# Patient Record
Sex: Female | Born: 1945 | Race: Black or African American | Hispanic: No | Marital: Married | Smoking: Never smoker
Health system: Southern US, Community
[De-identification: ages and names within clinical notes are randomized; demographics above are authoritative.]

## PROBLEM LIST (undated history)

## (undated) DIAGNOSIS — E039 Hypothyroidism, unspecified: Secondary | ICD-10-CM

## (undated) DIAGNOSIS — IMO0002 Reserved for concepts with insufficient information to code with codable children: Secondary | ICD-10-CM

## (undated) DIAGNOSIS — E785 Hyperlipidemia, unspecified: Secondary | ICD-10-CM

## (undated) DIAGNOSIS — E1065 Type 1 diabetes mellitus with hyperglycemia: Secondary | ICD-10-CM

## (undated) DIAGNOSIS — I10 Essential (primary) hypertension: Secondary | ICD-10-CM

## (undated) HISTORY — PX: ABDOMINAL HYSTERECTOMY: SHX81

---

## 2013-11-05 ENCOUNTER — Encounter (HOSPITAL_COMMUNITY): Payer: Self-pay | Admitting: Cardiology

## 2013-11-05 ENCOUNTER — Inpatient Hospital Stay (HOSPITAL_COMMUNITY): Payer: Medicare (Managed Care)

## 2013-11-05 ENCOUNTER — Inpatient Hospital Stay (HOSPITAL_COMMUNITY)
Admission: EM | Admit: 2013-11-05 | Discharge: 2013-11-08 | DRG: 919 | Disposition: A | Discharge status: Home / Self Care | Payer: Medicare (Managed Care) | Attending: Pulmonary Disease | Admitting: Pulmonary Disease

## 2013-11-05 ENCOUNTER — Emergency Department (HOSPITAL_COMMUNITY): Payer: Medicare (Managed Care)

## 2013-11-05 DIAGNOSIS — D638 Anemia in other chronic diseases classified elsewhere: Secondary | ICD-10-CM | POA: Diagnosis present

## 2013-11-05 DIAGNOSIS — E875 Hyperkalemia: Secondary | ICD-10-CM | POA: Diagnosis present

## 2013-11-05 DIAGNOSIS — E1110 Type 2 diabetes mellitus with ketoacidosis without coma: Secondary | ICD-10-CM

## 2013-11-05 DIAGNOSIS — T85694A Other mechanical complication of insulin pump, initial encounter: Principal | ICD-10-CM | POA: Diagnosis present

## 2013-11-05 DIAGNOSIS — Z794 Long term (current) use of insulin: Secondary | ICD-10-CM

## 2013-11-05 DIAGNOSIS — I10 Essential (primary) hypertension: Secondary | ICD-10-CM | POA: Diagnosis present

## 2013-11-05 DIAGNOSIS — N179 Acute kidney failure, unspecified: Secondary | ICD-10-CM | POA: Diagnosis present

## 2013-11-05 DIAGNOSIS — D72829 Elevated white blood cell count, unspecified: Secondary | ICD-10-CM

## 2013-11-05 DIAGNOSIS — IMO0002 Reserved for concepts with insufficient information to code with codable children: Secondary | ICD-10-CM | POA: Diagnosis present

## 2013-11-05 DIAGNOSIS — E1010 Type 1 diabetes mellitus with ketoacidosis without coma: Secondary | ICD-10-CM | POA: Diagnosis present

## 2013-11-05 DIAGNOSIS — Y838 Other surgical procedures as the cause of abnormal reaction of the patient, or of later complication, without mention of misadventure at the time of the procedure: Secondary | ICD-10-CM | POA: Diagnosis present

## 2013-11-05 DIAGNOSIS — E876 Hypokalemia: Secondary | ICD-10-CM | POA: Diagnosis present

## 2013-11-05 DIAGNOSIS — Z9641 Presence of insulin pump (external) (internal): Secondary | ICD-10-CM

## 2013-11-05 DIAGNOSIS — E039 Hypothyroidism, unspecified: Secondary | ICD-10-CM | POA: Diagnosis present

## 2013-11-05 DIAGNOSIS — G9340 Encephalopathy, unspecified: Secondary | ICD-10-CM | POA: Diagnosis not present

## 2013-11-05 DIAGNOSIS — Z79899 Other long term (current) drug therapy: Secondary | ICD-10-CM

## 2013-11-05 DIAGNOSIS — R739 Hyperglycemia, unspecified: Secondary | ICD-10-CM | POA: Diagnosis present

## 2013-11-05 DIAGNOSIS — R4182 Altered mental status, unspecified: Secondary | ICD-10-CM | POA: Diagnosis present

## 2013-11-05 DIAGNOSIS — N3289 Other specified disorders of bladder: Secondary | ICD-10-CM | POA: Diagnosis present

## 2013-11-05 DIAGNOSIS — E1065 Type 1 diabetes mellitus with hyperglycemia: Secondary | ICD-10-CM | POA: Diagnosis present

## 2013-11-05 DIAGNOSIS — E861 Hypovolemia: Secondary | ICD-10-CM | POA: Diagnosis present

## 2013-11-05 DIAGNOSIS — I959 Hypotension, unspecified: Secondary | ICD-10-CM | POA: Diagnosis present

## 2013-11-05 DIAGNOSIS — E785 Hyperlipidemia, unspecified: Secondary | ICD-10-CM | POA: Diagnosis present

## 2013-11-05 DIAGNOSIS — R9431 Abnormal electrocardiogram [ECG] [EKG]: Secondary | ICD-10-CM | POA: Diagnosis present

## 2013-11-05 HISTORY — DX: Hypothyroidism, unspecified: E03.9

## 2013-11-05 HISTORY — DX: Reserved for concepts with insufficient information to code with codable children: IMO0002

## 2013-11-05 HISTORY — DX: Hyperlipidemia, unspecified: E78.5

## 2013-11-05 HISTORY — DX: Type 1 diabetes mellitus with hyperglycemia: E10.65

## 2013-11-05 HISTORY — DX: Essential (primary) hypertension: I10

## 2013-11-05 LAB — URINALYSIS, ROUTINE W REFLEX MICROSCOPIC
Glucose, UA: >1000 mg/dL — AB
Leukocytes, UA: NEGATIVE
Nitrite: NEGATIVE
Specific Gravity, Urine: 1.025 (ref 1.005–1.030)
pH: 5 (ref 5.0–8.0)

## 2013-11-05 LAB — CBC
HCT: 37.9 % (ref 36.0–46.0)
Hemoglobin: 11.5 g/dL — ABNORMAL LOW (ref 12.0–15.0)
MCHC: 30.3 g/dL (ref 30.0–36.0)
MCV: 96.7 fL (ref 78.0–100.0)
WBC: 14.4 10*3/uL — ABNORMAL HIGH (ref 4.0–10.5)

## 2013-11-05 LAB — POCT I-STAT 3, ART BLOOD GAS (G3+)
O2 Saturation: 100 %
TCO2: 9 mmol/L (ref 0–100)
pCO2 arterial: 21.1 mmHg — ABNORMAL LOW (ref 35.0–45.0)
pO2, Arterial: 204 mmHg — ABNORMAL HIGH (ref 80.0–100.0)

## 2013-11-05 LAB — COMPREHENSIVE METABOLIC PANEL
ALT: 31 U/L (ref 0–35)
AST: 26 U/L (ref 0–37)
Albumin: 3.1 g/dL — ABNORMAL LOW (ref 3.5–5.2)
CO2: 9 mEq/L — CL (ref 19–32)
Chloride: 86 mEq/L — ABNORMAL LOW (ref 96–112)
GFR calc Af Amer: 17 mL/min — ABNORMAL LOW (ref >90)
GFR calc non Af Amer: 15 mL/min — ABNORMAL LOW (ref >90)
Glucose, Bld: 1164 mg/dL (ref 70–99)
Sodium: 128 mEq/L — ABNORMAL LOW (ref 135–145)
Total Bilirubin: 0.6 mg/dL (ref 0.3–1.2)

## 2013-11-05 LAB — URINE MICROSCOPIC-ADD ON

## 2013-11-05 LAB — POCT I-STAT, CHEM 8
Calcium, Ion: 1 mmol/L — ABNORMAL LOW (ref 1.13–1.30)
Glucose, Bld: >700 mg/dL (ref 70–99)
HCT: 38 % (ref 36.0–46.0)
Hemoglobin: 12.9 g/dL (ref 12.0–15.0)
TCO2: 11 mmol/L (ref 0–100)

## 2013-11-05 LAB — POCT I-STAT TROPONIN I: Troponin i, poc: 0.02 ng/mL (ref 0.00–0.08)

## 2013-11-05 LAB — PROTIME-INR: INR: 1.58 — ABNORMAL HIGH (ref 0.00–1.49)

## 2013-11-05 LAB — GLUCOSE, CAPILLARY: Glucose-Capillary: >600 mg/dL (ref 70–99)

## 2013-11-05 LAB — APTT: aPTT: 28 seconds (ref 24–37)

## 2013-11-05 MED ORDER — SODIUM CHLORIDE 0.9 % IV BOLUS (SEPSIS)
1000.0000 mL | Freq: Once | INTRAVENOUS | Status: AC
  Administered 2013-11-05: 1000 mL via INTRAVENOUS

## 2013-11-05 MED ORDER — SODIUM CHLORIDE 0.9 % IV SOLN
INTRAVENOUS | Status: DC
  Administered 2013-11-05 – 2013-11-06 (×2): via INTRAVENOUS

## 2013-11-05 MED ORDER — DEXTROSE-NACL 5-0.45 % IV SOLN
INTRAVENOUS | Status: DC
  Administered 2013-11-06: 08:00:00 via INTRAVENOUS

## 2013-11-05 MED ORDER — SODIUM BICARBONATE 8.4 % IV SOLN
50.0000 meq | Freq: Once | INTRAVENOUS | Status: AC
  Administered 2013-11-05: 50 meq via INTRAVENOUS

## 2013-11-05 MED ORDER — CALCIUM CHLORIDE 10 % IV SOLN
INTRAVENOUS | Status: DC | PRN
  Administered 2013-11-05: 1 g via INTRAVENOUS

## 2013-11-05 MED ORDER — PANTOPRAZOLE SODIUM 40 MG IV SOLR
40.0000 mg | INTRAVENOUS | Status: DC
  Administered 2013-11-05: 40 mg via INTRAVENOUS
  Filled 2013-11-05 (×2): qty 40

## 2013-11-05 MED ORDER — HEPARIN SODIUM (PORCINE) 5000 UNIT/ML IJ SOLN
5000.0000 [IU] | Freq: Three times a day (TID) | INTRAMUSCULAR | Status: DC
  Administered 2013-11-05 – 2013-11-06 (×2): 5000 [IU] via SUBCUTANEOUS
  Filled 2013-11-05 (×5): qty 1

## 2013-11-05 MED ORDER — ASPIRIN 300 MG RE SUPP
300.0000 mg | Freq: Once | RECTAL | Status: AC
  Administered 2013-11-05: 300 mg via RECTAL
  Filled 2013-11-05: qty 1

## 2013-11-05 MED ORDER — ASPIRIN 81 MG PO CHEW: 324.0000 mg | CHEWABLE_TABLET | Freq: Once | ORAL | Status: DC

## 2013-11-05 MED ORDER — ONDANSETRON HCL 4 MG/2ML IJ SOLN: 4.0000 mg | Freq: Four times a day (QID) | INTRAMUSCULAR | Status: DC | PRN

## 2013-11-05 MED ORDER — DEXTROSE 50 % IV SOLN
25.0000 mL | INTRAVENOUS | Status: DC | PRN
  Administered 2013-11-06: 25 mL via INTRAVENOUS
  Filled 2013-11-05: qty 50

## 2013-11-05 MED ORDER — SODIUM CHLORIDE 0.9 % IV SOLN: INTRAVENOUS | Status: DC

## 2013-11-05 MED ORDER — SODIUM CHLORIDE 0.9 % IV SOLN
INTRAVENOUS | Status: DC
  Administered 2013-11-05: 5.4 [IU]/h via INTRAVENOUS
  Administered 2013-11-06: 21.2 [IU]/h via INTRAVENOUS
  Administered 2013-11-06: 24.1 [IU]/h via INTRAVENOUS
  Filled 2013-11-05 (×4): qty 1

## 2013-11-05 MED ORDER — SODIUM CHLORIDE 0.9 % IV SOLN
INTRAVENOUS | Status: DC
  Administered 2013-11-05: 20 mL/h via INTRAVENOUS

## 2013-11-05 MED ORDER — SODIUM CHLORIDE 0.9 % IV SOLN: 1.0000 g | Freq: Once | INTRAVENOUS | Status: DC

## 2013-11-05 MED ORDER — SODIUM BICARBONATE 8.4 % IV SOLN: 50.0000 meq | Freq: Once | INTRAVENOUS | Status: DC

## 2013-11-05 MED ORDER — INSULIN ASPART 100 UNIT/ML ~~LOC~~ SOLN
10.0000 [IU] | Freq: Once | SUBCUTANEOUS | Status: AC
  Administered 2013-11-05: 10 [IU] via INTRAVENOUS
  Filled 2013-11-05: qty 1

## 2013-11-05 MED ORDER — CALCIUM CHLORIDE 10 % IV SOLN
INTRAVENOUS | Status: AC | PRN
  Administered 2013-11-05: 1 g via INTRAVENOUS

## 2013-11-05 MED ORDER — HEPARIN SODIUM (PORCINE) 5000 UNIT/ML IJ SOLN: 60.0000 [IU]/kg | INTRAMUSCULAR | Status: DC

## 2013-11-05 NOTE — Consult Note (Signed)
Reason for Consult:Acute ST elevation, RBBB, LAFB, brady cardia with need for transth. pacing   Referring Physician: ER MD  No primary provider on file.  In [facility] Primary Cardiologist:New Dr. Royann Shivers  Latoya Young is an 67 y.o. female.    Chief Complaint: altered mental status, elevated glucose, chest pain, but she fell this AM also   HPI: 67 year old AAF presents by EMS with AMS, glucose > 500, acute ST elevation.  She and her husband are visiting from outside [place], [place] and they do not believe her insulin pump is working well.  Her glucose became elevated last pm and continued to be elevated this AM.  She was stumbling around the motel room, actually fell once.  By this PM she was not responding well and husband called EMS.  EKG with RBBB and ST elevation.  But peaked T waves also, en route she also had significant bradycardia requiring  external pacing.  STEMI was called and we came to eval, though EKG seemed to reflect hyperkalemia.  Pt was responding, does have epigastric pain.   K+ found to be 7.7.  Treatment initiated.  EKG improved with resolution of ST elevation, though RBBB continues and LAFB.  HR improved as well.  BP low, pt appears dry.  STEMI was cancelled.  CCM has been asked to admit.  We will continue to follow.  External pacer turned off as pt has SR now.  Hx of diabetes since age 68, now on insulin pump.  She has hx of hypothyroid, Rx'd and poss. HTN, she is on low dose lopressor and zocor.  Recent UTI that was treated.       Past Medical History  Diagnosis Date  . Diabetes type 1, uncontrolled     uses insulin pump  . HTN (hypertension)   . Hyperlipidemia   . Hypothyroid     History reviewed. No pertinent past surgical history.  History reviewed. No pertinent family history. Parents without CAD, no family hx CAD.  Social History:  reports that she has never smoked. She has never used smokeless tobacco. She reports that she does not drink alcohol or use  illicit drugs. Married and 3 children.  Allergies: No Known Allergies  OUTPATIENT Meds: From [facility] # [phone] Lopressor 25 mg half tab BID Synthroid 50 mcg daily zocor 10 mg daily Diazepam    Results for orders placed during the hospital encounter of 11/05/13 (from the past 48 hour(s))  POCT I-STAT, CHEM 8     Status: Abnormal   Collection Time    11/05/13  4:51 PM      Result Value Range   Sodium 129 (*) 135 - 145 mEq/L   Potassium 7.7 (*) 3.5 - 5.1 mEq/L   Chloride 100  96 - 112 mEq/L   BUN 52 (*) 6 - 23 mg/dL   Creatinine, Ser 9.56 (*) 0.50 - 1.10 mg/dL   Glucose, Bld >213 (*) 70 - 99 mg/dL   Calcium, Ion 0.86 (*) 1.13 - 1.30 mmol/L   TCO2 11  0 - 100 mmol/L   Hemoglobin 12.9  12.0 - 15.0 g/dL   HCT 57.8  46.9 - 62.9 %   Comment NOTIFIED PHYSICIAN    POCT I-STAT 3, BLOOD GAS (G3+)     Status: Abnormal   Collection Time    11/05/13  4:56 PM      Result Value Range   pH, Arterial 7.206 (*) 7.350 - 7.450  pCO2 arterial 21.1 (*) 35.0 - 45.0 mmHg   pO2, Arterial 204.0 (*) 80.0 - 100.0 mmHg   Bicarbonate 8.4 (*) 20.0 - 24.0 mEq/L   TCO2 9  0 - 100 mmol/L   O2 Saturation 100.0     Acid-base deficit 18.0 (*) 0.0 - 2.0 mmol/L   Collection site RADIAL, ALLEN'S TEST ACCEPTABLE     Drawn by Operator     Sample type ARTERIAL     Comment NOTIFIED PHYSICIAN     No results found.  ROS: General:no colds or fevers, no weight changes, was in her usual state of health until last pm Skin:no rashes or ulcers HEENT:no blurred vision, no congestion CV:see HPI PUL:see HPI GI:no diarrhea constipation or melena, no indigestion GU:no hematuria, no dysuria MS:no joint pain, no claudication Neuro:no syncope, + lightheadedness, off balance today fell today Endo:+ diabetes, + thyroid disease   Blood pressure 95/45, pulse 78, temperature 97.6 F (36.4 C), temperature source Oral, resp. rate 21, SpO2 98.00%. PE: General: answers questions, but lethargic, ill  appearing Skin:Warm to cool and dry, brisk capillary refill HEENT:normocephalic, sclera clear, mucus membranes dry Neck:supple, no JVD, no bruits  Heart:S1S2 RRR without murmur, gallup, rub or click Lungs:clear, ant. without rales, rhonchi, or wheezes ZOX:WRUE, non tender, + BS, do not palpate liver spleen or masses, insulin pump in place Ext:no lower ext edema, ? pedal pulses with hypotension, 2+ radial pulses Neuro:alert and oriented, MAE, follows commands,     Assessment/Plan Principal Problem:   Hyperglycemia Active Problems:   Altered mental status   ST elevation, secondary to hyperkalemia   Hyperkalemia   Diabetes type 1, uncontrolled, new uncontrolled possibly due to pump malfunction  Plan:  No cardiac cath, believe EKG changes due to hyperglycemia. External pacer turned off now with SR. See Dr. Erin Hearing note.       [facility] R  Nurse Practitioner Certified [facility] Pager [phone] or after 5pm or weekends call [phone] 11/05/2013, 5:07 PM   Please also see my note. Agree with findings above. Thurmon Fair, MD, [facility] [phone] office [phone] pager

## 2013-11-05 NOTE — ED Provider Notes (Signed)
CSN: [id number]     Arrival date & time 11/05/13  1632 History   First MD Initiated Contact with Patient 11/05/13 1642     Chief Complaint  Patient presents with  . Altered Mental Status   (Consider location/radiation/quality/duration/timing/severity/associated sxs/prior Treatment) HPI Latoya Young is a 67 y.o. female who presents to the emergency department for concern of AMS, hyperglycemia, shock, and possible STEMI.  Per family, patient has history of IDDM and is on insulin pump.  She has been having problems with her blood glucose since last night and it has been slowly getting higher and higher.  She reported yesterday evening that she felt the insulin was not working.  Then today she started "going in and out of it."  Husband checked blood glucose and found that it was >600 and called EMS.  When EMS arrived, patient severely altered with BP of 70 systolic.  EKG done and showing RBBB with ST elevation.  Concern for possible STEMI.  Glucose >700.  NS bolus initiated.  Then patient had a pause lasting several seconds enroute.  TC pacing initiated. Patient brought in for further evaluation.  Per family, patient never complaining of chest pain.  Past Medical History  Diagnosis Date  . Diabetes type 1, uncontrolled     uses insulin pump  . HTN (hypertension)   . Hyperlipidemia   . Hypothyroid    Past Surgical History  Procedure Laterality Date  . Abdominal hysterectomy     History reviewed. No pertinent family history. History  Substance Use Topics  . Smoking status: Never Smoker   . Smokeless tobacco: Never Used  . Alcohol Use: No   OB History   Grav Para Term Preterm Abortions TAB SAB Ect Mult Living                 Review of Systems  Unable to perform ROS: Acuity of condition    Allergies  Review of patient's allergies indicates no known allergies.  Home Medications  No current outpatient prescriptions on file. BP 95/45  Pulse 78  Temp(Src) 97.6 F (36.4 C) (Oral)   Resp 21  Wt 155 lb (70.308 kg)  SpO2 98% Physical Exam  Nursing note and vitals reviewed. Constitutional: She is oriented to person, place, and time. She appears well-developed and well-nourished. No distress.  HENT:  Head: Normocephalic and atraumatic.  Right Ear: External ear normal.  Left Ear: External ear normal.  Nose: Nose normal.  Mouth/Throat: Oropharynx is clear and moist. Mucous membranes are dry. No oropharyngeal exudate.  Eyes: EOM are normal. Pupils are equal, round, and reactive to light.  Neck: Normal range of motion. Neck supple. No tracheal deviation present.  Cardiovascular: Normal rate.   Pulmonary/Chest: Effort normal and breath sounds normal. No stridor. No respiratory distress. She has no wheezes. She has no rales.  Abdominal: Soft. She exhibits no distension. There is no tenderness. There is no rebound.  Musculoskeletal: Normal range of motion.  Neurological: She is alert and oriented to person, place, and time. GCS eye subscore is 3. GCS verbal subscore is 5. GCS motor subscore is 5.  Skin: Skin is warm and dry. She is not diaphoretic.    ED Course  Procedures (including critical care time) Labs Review Labs Reviewed  CBC - Abnormal; Notable for the following:    WBC 14.4 (*)    Hemoglobin 11.5 (*)    All other components within normal limits  POCT I-STAT, CHEM 8 - Abnormal; Notable for the following:  Sodium 129 (*)    Potassium 7.7 (*)    BUN 52 (*)    Creatinine, Ser 3.40 (*)    Glucose, Bld >700 (*)    Calcium, Ion 1.00 (*)    All other components within normal limits  POCT I-STAT 3, BLOOD GAS (G3+) - Abnormal; Notable for the following:    pH, Arterial 7.206 (*)    pCO2 arterial 21.1 (*)    pO2, Arterial 204.0 (*)    Bicarbonate 8.4 (*)    Acid-base deficit 18.0 (*)    All other components within normal limits  APTT  PROTIME-INR  COMPREHENSIVE METABOLIC PANEL  POCT I-STAT TROPONIN I   Imaging Review Dg Chest Portable 1  View  11/05/2013   CLINICAL DATA:  Hyperglycemia, bradycardia, altered mental status, history hypertension, type 1 diabetes  EXAM: PORTABLE CHEST - 1 VIEW  COMPARISON:  Portable exam 1719 hr without priors for comparison  FINDINGS: Upper normal heart size with slight pulmonary vascular congestion.  Atherosclerotic calcification aortic arch.  Lungs clear.  No pleural effusion or pneumothorax.  Old lower left rib fractures.  IMPRESSION: No acute abnormalities.   Electronically Signed   By: Ulyses Southward M.D.   On: 11/05/2013 17:34    EKG Interpretation    Date/Time:    Ventricular Rate:    PR Interval:    QRS Duration:   QT Interval:    QTC Calculation:   R Axis:     Text Interpretation:              MDM   1. ST elevation   2. DKA, type 1   3. Hyperkalemia   4. Hyperglycemia   5. Altered mental status   6. Acute renal failure   7. Leukocytosis    Latoya Young is a 67 y.o. female with history of type 1 diabetes who presented to the emergency department by EMS with numerous emergent conditions.  On arrival, patient protecting airway with bilateral breath sounds.  Hypotensive on arrival with 70 systolic.  STEMI team at bedside.  STEMI team less convinced as patient not having chest pain and EKG more c/w hyperkalemia.  Fluid bolus initiated.  I-stat potassium came back at 7.  2 amps of calcium and 2 amps of bicarb administered with some resolution in EKG changes.  A total of 4L NS administered with marked improvement in mentation and GCS increase to 15.  Nephrology not consulted emergently as hyperkalemia likely secondary to acidosis.  Critical care consulted given multitude of problems.  No evidence of infection driving this.  Likely secondary to faulty pump placement.  Patient safe for admission to ICU.  Patient admitted.    Arloa Koh, MD 11/05/13 [phone]

## 2013-11-05 NOTE — ED Notes (Signed)
Patient arrived via [facility] with altered level of consciousness. EMS states she was only responsive to painful stimuli for them. BP 70/40, cbg greater than 500. EKG showed ST elevations with pauses 3-4 secs. EMS externally paced at 65, liter bolus of NS administered. Patient has an insulin pump to her right thigh. NRB in place.

## 2013-11-05 NOTE — ED Notes (Signed)
Patients husband stated that patients insulin pump became displaced yesterday and patient tried to reinsert it. Pump has not been working since then. EDP removed pump.

## 2013-11-05 NOTE — ED Notes (Signed)
Unable to start glucosestabalizer due to system being down. Called the pharmacy who referred me to the diabetes coordinator. Paged them, waiting for response.

## 2013-11-05 NOTE — Significant Event (Signed)
Pt hypotensive.  Has significant urine outpt in setting of hyperglycemia.  Will give fluid bolus.  Coralyn Helling, MD 11/05/2013, 10:00 PM

## 2013-11-05 NOTE — ED Notes (Signed)
Family at bedside. 

## 2013-11-05 NOTE — ED Notes (Signed)
PCCM finished at [facility], lab at [facility] obtaining BMET and troponin, husband stepped out, insulin infusing, 4L boluses complete, NS liter # 5 hanging KVO, pt sleeping, lethargic with sonorous resps, arousable to physical and verbal stimuli, unable to keep a conversation or answer questions, follows some commands , makes some appropriate verbal communication. VSS.

## 2013-11-05 NOTE — ED Notes (Signed)
[facility] MD at bedside.

## 2013-11-05 NOTE — Progress Notes (Signed)
Chaplain respond to code stemi after referral from previous chaplain. Did follow up. Offered spiritual care. None needed.  Chaplain H.Astin Sayre

## 2013-11-05 NOTE — Consult Note (Signed)
Called for possible STEMI. Per family report, Mrs. Kimbrough has no history of heart disease, but has had insulin requiring DM since age 67. She is visiting from [facility]. Patient found unresponsive after having problems with hyperglycemia, possibly due to a kinked insulin pump tubing. She did not complain of chest pain or dyspnea prior to losing consciousness. EMS-ECG showed ST elevation in V1-V2 with reciprocal ST depression in the inferior leads, RBBB+LAFB, interpreted as likely STEMI. During transport she had sinus pauses and transcutaneous pacing was used. She woke up due to the discomfort of pacing.  In the ED she was hypotensive, but had native sinus rhythm, mildly bradycardic.  Her exam is significant for hypotension and signs of hypovolemia (cool extremities, dry mucous membranes), normal cardiac exam, no palpable pedal pulses. The T waves were markedly peaked. STAT labs showed a K of 7.7. The heart rate and BP immediately improved with the administration of intravenous calcium and sodium bicarbonate. This was associated with immediate improvement in the ST segment changes, although the AV conduction abnormality persisted. She remained alert and communicative. Code STEMI was called off. ECG changes probably mostly if not solely due to hyperkalemia. Will follow along. An old ECG tracing would be very valuable in decision making - whether any further cardiac workup is indicated at this time.  Thurmon Fair, MD, [facility] [phone] office [phone] pager

## 2013-11-06 DIAGNOSIS — I369 Nonrheumatic tricuspid valve disorder, unspecified: Secondary | ICD-10-CM

## 2013-11-06 DIAGNOSIS — E1110 Type 2 diabetes mellitus with ketoacidosis without coma: Secondary | ICD-10-CM

## 2013-11-06 DIAGNOSIS — E875 Hyperkalemia: Secondary | ICD-10-CM

## 2013-11-06 LAB — BASIC METABOLIC PANEL
BUN: 44 mg/dL — ABNORMAL HIGH (ref 6–23)
BUN: 45 mg/dL — ABNORMAL HIGH (ref 6–23)
BUN: 52 mg/dL — ABNORMAL HIGH (ref 6–23)
CO2: 14 mEq/L — ABNORMAL LOW (ref 19–32)
CO2: 22 mEq/L (ref 19–32)
CO2: 23 mEq/L (ref 19–32)
CO2: 23 mEq/L (ref 19–32)
Calcium: 8.3 mg/dL — ABNORMAL LOW (ref 8.4–10.5)
Calcium: 8.7 mg/dL (ref 8.4–10.5)
Calcium: 9.2 mg/dL (ref 8.4–10.5)
Calcium: 9.3 mg/dL (ref 8.4–10.5)
Chloride: 113 mEq/L — ABNORMAL HIGH (ref 96–112)
Chloride: 115 mEq/L — ABNORMAL HIGH (ref 96–112)
Creatinine, Ser: 1.72 mg/dL — ABNORMAL HIGH (ref 0.50–1.10)
Creatinine, Ser: 2.26 mg/dL — ABNORMAL HIGH (ref 0.50–1.10)
Creatinine, Ser: 2.34 mg/dL — ABNORMAL HIGH (ref 0.50–1.10)
GFR calc Af Amer: 25 mL/min — ABNORMAL LOW (ref >90)
GFR calc Af Amer: 31 mL/min — ABNORMAL LOW (ref >90)
GFR calc non Af Amer: 21 mL/min — ABNORMAL LOW (ref >90)
GFR calc non Af Amer: 22 mL/min — ABNORMAL LOW (ref >90)
GFR calc non Af Amer: 27 mL/min — ABNORMAL LOW (ref >90)
GFR calc non Af Amer: 30 mL/min — ABNORMAL LOW (ref >90)
Glucose, Bld: 142 mg/dL — ABNORMAL HIGH (ref 70–99)
Glucose, Bld: 187 mg/dL — ABNORMAL HIGH (ref 70–99)
Glucose, Bld: 464 mg/dL — ABNORMAL HIGH (ref 70–99)
Glucose, Bld: 599 mg/dL (ref 70–99)
Glucose, Bld: 704 mg/dL (ref 70–99)
Potassium: 3.7 mEq/L (ref 3.5–5.1)
Sodium: 147 mEq/L — ABNORMAL HIGH (ref 135–145)
Sodium: 148 mEq/L — ABNORMAL HIGH (ref 135–145)
Sodium: 151 mEq/L — ABNORMAL HIGH (ref 135–145)
Sodium: 152 mEq/L — ABNORMAL HIGH (ref 135–145)

## 2013-11-06 LAB — GLUCOSE, CAPILLARY
Glucose-Capillary: 169 mg/dL — ABNORMAL HIGH (ref 70–99)
Glucose-Capillary: 293 mg/dL — ABNORMAL HIGH (ref 70–99)
Glucose-Capillary: 296 mg/dL — ABNORMAL HIGH (ref 70–99)
Glucose-Capillary: 360 mg/dL — ABNORMAL HIGH (ref 70–99)
Glucose-Capillary: 515 mg/dL — ABNORMAL HIGH (ref 70–99)
Glucose-Capillary: 577 mg/dL (ref 70–99)
Glucose-Capillary: 74 mg/dL (ref 70–99)
Glucose-Capillary: 87 mg/dL (ref 70–99)
Glucose-Capillary: 98 mg/dL (ref 70–99)
Glucose-Capillary: >600 mg/dL (ref 70–99)

## 2013-11-06 LAB — MAGNESIUM: Magnesium: 2.3 mg/dL (ref 1.5–2.5)

## 2013-11-06 LAB — CBC
Hemoglobin: 10.3 g/dL — ABNORMAL LOW (ref 12.0–15.0)
MCH: 28.9 pg (ref 26.0–34.0)
MCHC: 32.2 g/dL (ref 30.0–36.0)
Platelets: 149 10*3/uL — ABNORMAL LOW (ref 150–400)
RBC: 3.57 MIL/uL — ABNORMAL LOW (ref 3.87–5.11)
RDW: 12.8 % (ref 11.5–15.5)

## 2013-11-06 LAB — PHOSPHORUS: Phosphorus: 1.9 mg/dL — ABNORMAL LOW (ref 2.3–4.6)

## 2013-11-06 LAB — TROPONIN I: Troponin I: <0.3 ng/mL (ref <0.30)

## 2013-11-06 MED ORDER — POTASSIUM CHLORIDE 10 MEQ/50ML IV SOLN
INTRAVENOUS | Status: AC
  Administered 2013-11-06: 10 meq via INTRAVENOUS
  Filled 2013-11-06: qty 50

## 2013-11-06 MED ORDER — INSULIN GLARGINE 100 UNIT/ML ~~LOC~~ SOLN
20.0000 [IU] | Freq: Every day | SUBCUTANEOUS | Status: DC
  Administered 2013-11-06 – 2013-11-08 (×3): 20 [IU] via SUBCUTANEOUS
  Filled 2013-11-06 (×4): qty 0.2

## 2013-11-06 MED ORDER — POTASSIUM CHLORIDE 10 MEQ/100ML IV SOLN: 10.0000 meq | INTRAVENOUS | Status: DC

## 2013-11-06 MED ORDER — POTASSIUM CHLORIDE 10 MEQ/50ML IV SOLN
10.0000 meq | INTRAVENOUS | Status: AC
  Administered 2013-11-06 (×3): 10 meq via INTRAVENOUS
  Filled 2013-11-06 (×2): qty 50

## 2013-11-06 MED ORDER — DEXTROSE 5 % IV SOLN
INTRAVENOUS | Status: DC
  Administered 2013-11-06 – 2013-11-07 (×2): via INTRAVENOUS

## 2013-11-06 MED ORDER — WHITE PETROLATUM GEL
Status: AC
  Administered 2013-11-06: 01:00:00
  Filled 2013-11-06: qty 5

## 2013-11-06 MED ORDER — ENOXAPARIN SODIUM 40 MG/0.4ML ~~LOC~~ SOLN
40.0000 mg | SUBCUTANEOUS | Status: DC
  Administered 2013-11-06 – 2013-11-07 (×2): 40 mg via SUBCUTANEOUS
  Filled 2013-11-06 (×3): qty 0.4

## 2013-11-06 MED ORDER — INSULIN ASPART 100 UNIT/ML ~~LOC~~ SOLN
0.0000 [IU] | SUBCUTANEOUS | Status: DC
  Administered 2013-11-06: 3 [IU] via SUBCUTANEOUS
  Administered 2013-11-07: 5 [IU] via SUBCUTANEOUS
  Administered 2013-11-07: 8 [IU] via SUBCUTANEOUS
  Administered 2013-11-07 (×2): 5 [IU] via SUBCUTANEOUS
  Administered 2013-11-07: 2 [IU] via SUBCUTANEOUS

## 2013-11-06 MED FILL — Medication: Qty: 1 | Status: AC

## 2013-11-06 NOTE — Progress Notes (Signed)
  Echocardiogram 2D Echocardiogram has been performed.  Latoya Young 11/06/2013, 10:19 AM

## 2013-11-06 NOTE — Progress Notes (Signed)
  PULMONARY  / CRITICAL CARE MEDICINE  Name: Blayklee Mable MRN: [id number] DOB: August 03, 1946    ADMISSION DATE:  11/05/2013  PRIMARY SERVICE: PCCM  CHIEF COMPLAINT:  DKA  BRIEF PATIENT DESCRIPTION:  67 years old female with PMH relevant for DM1. Presented with DKA due to malfunction of her insulin pump.   SIGNIFICANT EVENTS / STUDIES:  12/12 Echo:   LINES / TUBES: L IJ CVL 12/11 >>   CULTURES:   ANTIBIOTICS:   SUBJECTIVE:  RASS 0. + F/C. NAD  VITAL SIGNS: Temp:  [95.7 F (35.4 C)-100 F (37.8 C)] 100 F (37.8 C) (12/12 1400) Pulse Rate:  [60-84] 79 (12/12 1350) Resp:  [12-24] 17 (12/12 1400) BP: (61-114)/(33-87) 96/39 mmHg (12/12 1400) SpO2:  [94 %-100 %] 100 % (12/12 1350) Weight:  [70.308 kg (155 lb)-75.5 kg (166 lb 7.2 oz)] 75.5 kg (166 lb 7.2 oz) (12/11 2100) HEMODYNAMICS:   VENTILATOR SETTINGS:   INTAKE / OUTPUT: Intake/Output     12/11 0701 - 12/12 0700 12/12 0701 - 12/13 0700   I.V. (mL/kg) 5614.6 (74.4) 451.4 (6)   IV Piggyback 2200    Total Intake(mL/kg) 7814.6 (103.5) 451.4 (6)   Urine (mL/kg/hr) 2455 245 (0.4)   Total Output 2455 245   Net +5359.6 +206.4        Stool Occurrence  1 x     PHYSICAL EXAMINATION: General: NAD Neuro: intact Heart: RRR s M Lungs: clear Abdomen: soft, NT, NABS Ext: warm, no edema   LABS:  I have reviewed all of today's lab results. Relevant abnormalities are discussed in the A/P section   CXR: NNF  ASSESSMENT / PLAN:  ENDOCRINE A:   DKA due to insulin pump malfunction P:   Transition off insulin gtt to Lantus and SSI Need to change SSI to ACHS once diet started   PULMONARY A: 1) No issues   CARDIOVASCULAR A:  Nonspecific EKG changes Negative cardiac markers P:  Monitor  RENAL A:  Acute renal failure, improving Hyperkalemia, resolved P:   Monitor BMET intermittently Correct electrolytes as indicated   GASTROINTESTINAL A:   1) No issues   HEMATOLOGIC A:   Anemia, likely chronic  disease.  P:  Monitor CBC intermittently  INFECTIOUS A:   1) No evidence of acute infection P:   Micro and abx as above  NEUROLOGIC A:   AMS, resolved P:   Monitor    Billy Fischer, MD ; [facility] service Mobile [phone].  After 5:30 PM or weekends, call [phone]

## 2013-11-06 NOTE — ED Provider Notes (Signed)
Medical screening examination/treatment/procedure(s) were conducted as a shared visit with resident-physician practitioner(s) and myself.  I personally evaluated the patient during the encounter.  Pt is a 66 y.o. female with pmhx as above presenting from hotel after pt fell, was complaining of L shoulder pain, and had elevated glu.  Pt initially seen as code STEMI with STE in leads precordial leads on transmitted EKG, but also with wide QRS and peaked T waves concerning for hyperkalemia.  Upon arrival pt denied CP, but was in acute distress with decreased mental status, hypotension.  Cardiology in ED upon arrival.  She was found to be in DKA w/  critically high glucose, potassium, metabolic acidosis.  Code STEMI cancelled. STE, QRS widening and peaked T waves improved w/ IVF hydration, insulin bolus/gtt, calcium gluconate & bicarb.  Pt's mental status & BP also improved w/ treatment. Cause of DKA likely due to pump failure. Pt admitted to critical care    EKG Interpretation    Date/Time:  Thursday November 05 2013 16:55:59 EST Ventricular Rate:  77 PR Interval:  184 QRS Duration: 114 QT Interval:  385 QTC Calculation: 436 R Axis:   -86 Text Interpretation:  Sinus rhythm Ventricular premature complex RAE, consider biatrial enlargement Wide QRS rhythm Low voltage, precordial leads Abnormal R-wave progression, early transition Minimal ST depression, inferior leads Confirmed by Khia Dieterich  MD, Kalin Kyler ([id number]) on 11/06/2013 2:26:26 PM            CRITICAL CARE Performed by: Toy Cookey, E Total critical care time: 40 mins Critical care time was exclusive of separately billable procedures and treating other patients. Critical care was necessary to treat or prevent imminent or life-threatening deterioration. Critical care was time spent personally by me on the following activities: development of treatment plan with patient and/or surrogate as well as nursing, discussions with consultants,  evaluation of patient's response to treatment, examination of patient, obtaining history from patient or surrogate, ordering and performing treatments and interventions, ordering and review of laboratory studies, ordering and review of radiographic studies, pulse oximetry and re-evaluation of patient's condition.   Shanna Cisco, MD 11/06/13 [phone]

## 2013-11-06 NOTE — H&P (Addendum)
PULMONARY  / CRITICAL CARE MEDICINE  Name: Latoya Young MRN: [id number] DOB: [birth year]    ADMISSION DATE:  11/05/2013  PRIMARY SERVICE: PCCM  CHIEF COMPLAINT:  DKA  BRIEF PATIENT DESCRIPTION:  67 years old female with PMH relevant for DM1. Presents with DKA due to malfunction of her insulin pump.   SIGNIFICANT EVENTS / STUDIES:    LINES / TUBES: - Left IJ CVC - Foley catheter - Right EJ peripheral IV  CULTURES: Pending  ANTIBIOTICS: No antibiotics  HISTORY OF PRESENT ILLNESS:   67 years old female with PMH relevant for DM1 on insulin pumpm. Visiting from [facility]. Admitted to the ED with altered mental status (lethargy) found to be in DKA. Apparently her insulin pump malfunctioned. She denies any infectious symptoms including cough, sputum production, fever, N/V/D, dysuria. Denies headaches. She did complain of chest pain. Initially Cardiology was called for possible STEMI but EKG changes were most likely related to hyperkalemia. At the time of my exam the patient is somnolent but arousable, oriented, slightly hypotensive. Denies any symptoms.   PAST MEDICAL HISTORY :  Past Medical History  Diagnosis Date  . Diabetes type 1, uncontrolled     uses insulin pump  . HTN (hypertension)   . Hyperlipidemia   . Hypothyroid    Past Surgical History  Procedure Laterality Date  . Abdominal hysterectomy     Prior to Admission medications   Not on File   No Known Allergies  FAMILY HISTORY:  History reviewed. No pertinent family history. SOCIAL HISTORY:  reports that she has never smoked. She has never used smokeless tobacco. She reports that she does not drink alcohol or use illicit drugs.  REVIEW OF SYSTEMS:  All systems reviewed and found negative except for what I mentioned in the HPI.   SUBJECTIVE:   VITAL SIGNS: Temp:  [95.7 F (35.4 C)-97.6 F (36.4 C)] 95.7 F (35.4 C) (12/11 2330) Pulse Rate:  [60-80] 73 (12/11 2300) Resp:  [12-21] 14 (12/11 2330) BP:  (61-99)/(33-79) 92/42 mmHg (12/11 2330) SpO2:  [96 %-100 %] 98 % (12/11 2300) Weight:  [155 lb (70.308 kg)-166 lb 7.2 oz (75.5 kg)] 166 lb 7.2 oz (75.5 kg) (12/11 2100) HEMODYNAMICS:   VENTILATOR SETTINGS:   INTAKE / OUTPUT: Intake/Output     12/11 0701 - 12/12 0700   I.V. (mL/kg) 4412.3 (58.4)   IV Piggyback 2000   Total Intake(mL/kg) 6412.3 (84.9)   Urine (mL/kg/hr) 1750   Total Output 1750   Net +4662.3         PHYSICAL EXAMINATION: General: No apparent distress. lethargic Eyes: Anicteric sclerae. ENT: Oropharynx clear. Dry mucous membranes. No thrush Lymph: No cervical, supraclavicular, or axillary lymphadenopathy. Heart: Normal S1, S2. No murmurs, rubs, or gallops appreciated. No bruits, equal pulses. Lungs: Normal excursion, no dullness to percussion. Good air movement bilaterally, without wheezes or crackles. Normal upper airway sounds without evidence of stridor. Abdomen: Abdomen soft, non-tender and not distended, normoactive bowel sounds. No hepatosplenomegaly or masses. Musculoskeletal: No clubbing or synovitis. Skin: No rashes or lesions Neuro: No focal neurologic deficits.  LABS:  CBC  Recent Labs Lab 11/05/13 1649 11/05/13 1651 11/05/13 2000  WBC 14.4*  --  14.4*  HGB 11.5* 12.9 10.3*  HCT 37.9 38.0 32.0*  PLT 158  --  149*   Coag's  Recent Labs Lab 11/05/13 1649  APTT 28  INR 1.58*   BMET  Recent Labs Lab 11/05/13 1650 11/05/13 1651  NA 128* 129*  K >7.5* 7.7*  CL  86* 100  CO2 9*  --   BUN 61* 52*  CREATININE 3.05* 3.40*  GLUCOSE 1164* >700*   Electrolytes  Recent Labs Lab 11/05/13 1650  CALCIUM 8.0*   Sepsis Markers No results found for this basename: LATICACIDVEN, PROCALCITON, O2SATVEN,  in the last 168 hours ABG  Recent Labs Lab 11/05/13 1656  PHART 7.206*  PCO2ART 21.1*  PO2ART 204.0*   Liver Enzymes  Recent Labs Lab 11/05/13 1650  AST 26  ALT 31  ALKPHOS 118*  BILITOT 0.6  ALBUMIN 3.1*   Cardiac  Enzymes  Recent Labs Lab 11/05/13 1728 11/05/13 2328  TROPONINI <0.30 <0.30   Glucose  Recent Labs Lab 11/05/13 1832 11/05/13 2110  GLUCAP >600* >600*    Imaging Dg Chest Portable 1 View  11/05/2013   CLINICAL DATA:  Hyperglycemia, bradycardia, altered mental status, history hypertension, type 1 diabetes  EXAM: PORTABLE CHEST - 1 VIEW  COMPARISON:  Portable exam 1719 hr without priors for comparison  FINDINGS: Upper normal heart size with slight pulmonary vascular congestion.  Atherosclerotic calcification aortic arch.  Lungs clear.  No pleural effusion or pneumothorax.  Old lower left rib fractures.  IMPRESSION: No acute abnormalities.   Electronically Signed   By: Ulyses Southward M.D.   On: 11/05/2013 17:34     CXR:  - CVC in adequate position - Mild basila atelectasis  ASSESSMENT / PLAN:  PULMONARY A: 1) No issues   CARDIOVASCULAR A:  1) EKG changes likely related to hyperkalemia P:  - Will follow troponin  RENAL A:  1) Acute renal failure, likely pre renal 2) Hyperkalemia P:   - Will continue IVF resuscitation - Will monitor K per DKA protocol on insulin drip, IVF's. Got calcium chloride in the ED.   GASTROINTESTINAL A:   1) No issues   HEMATOLOGIC A:   1) Anemia, likely chronic disease.  P:  - Will follow CBC  INFECTIOUS A:   1) No evidence of acute infection P:   - Will follow blood cultures  ENDOCRINE A:   - DKA P:   -DKA protocol -isotonic saline until euvolemic -insulin gtt per protocol,  transition to sq when anion gap is normal, bicarb is >18  -transition to D51/2 NS after blood glucose is <250, transition to NSL when able to take pos.  -close observation of K, replace as indicated.  -close observation of PO4 and replace as indicated  NEUROLOGIC A:   1) Lethargic. Non focal. P:   - Improving, will monitor.    I have personally obtained a history, examined the patient, evaluated laboratory and imaging results, formulated the  assessment and plan and placed orders. CRITICAL CARE: Critical Care Time devoted to patient care services described in this note is 45 minutes.   Overton Mam, MD Pulmonary and Critical Care Medicine [facility] Pager: [phone]  11/06/2013, 12:24 AM

## 2013-11-06 NOTE — Care Management Note (Signed)
    Page 1 of 1   11/06/2013     12:18:13 PM   CARE MANAGEMENT NOTE 11/06/2013  Patient:  Latoya Young,Latoya Young   Account Number:  [id number]  Date Initiated:  11/06/2013  Documentation initiated by:  Avie Arenas  Subjective/Objective Assessment:   Admitted with DKA - on insulin drip.     Action/Plan:   Anticipated DC Date:  11/09/2013   Anticipated DC Plan:  HOME/SELF CARE      DC Planning Services  CM consult      Choice offered to / List presented to:             Status of service:  In process, will continue to follow Medicare Important Message given?   (If response is "NO", the following Medicare IM given date fields will be blank) Date Medicare IM given:   Date Additional Medicare IM given:    Discharge Disposition:    Per UR Regulation:  Reviewed for med. necessity/level of care/duration of stay  If discussed at Long Length of Stay Meetings, dates discussed:    Comments:  ContactVallorie, [place] [phone]   [phone]  11-06-13 12:15pm Avie Arenas, RNBSN [phone] Independent at home - lives with husband.  Here from [place] to see neice graduate.  Had malfunctioning pump.  Pump off patient now - husband has.  Has PCP in [place]. Will take about 4-5 hours to get home depending on traffic. On list for diabetic educator to see and advise as will need back up insulins till can get home and get pump set up again.

## 2013-11-06 NOTE — Progress Notes (Signed)
Pt. Seen and examined. Agree with the NP/PA-C note as written.  EKG changes have resolved. Troponin was negative. I agree that her EKG changes were related to hyperkalemia in the setting of hyperglycemia. She denies any chest discomfort today. I reviewed her echo which shows normal systolic and diastolic function without wall motion abnormalities. I would recommend she follows up with her cardiologist in [facility] when she returns.  Will sign-off for now. Please contact us if we are needed over the weekend.  Chrystie Nose, MD, [facility] Attending Cardiologist [facility]

## 2013-11-06 NOTE — Progress Notes (Signed)
eLink Physician-Brief Progress Note Patient Name: Latoya Young DOB: Sep 26, 1946 MRN: [id number]  Date of Service  11/06/2013   HPI/Events of Note     eICU Interventions  Hypokalemia, repleted    Intervention Category Minor Interventions: Electrolytes abnormality - evaluation and management  Jenean Escandon 11/06/2013, 3:20 AM

## 2013-11-06 NOTE — Procedures (Signed)
Central Venous Catheter Insertion Procedure Note Latoya Young [id number] [birth year]  Procedure: Insertion of Central Venous Catheter Indications: Assessment of intravascular volume, Drug and/or fluid administration and Frequent blood sampling  Procedure Details Consent: Risks of procedure as well as the alternatives and risks of each were explained to the (patient/caregiver).  Consent for procedure obtained. Time Out: Verified patient identification, verified procedure, site/side was marked, verified correct patient position, special equipment/implants available, medications/allergies/relevent history reviewed, required imaging and test results available.  Performed  Maximum sterile technique was used including antiseptics, cap, gloves, gown, hand hygiene, mask and sheet. Skin prep: Chlorhexidine; local anesthetic administered A antimicrobial bonded/coated triple lumen catheter was placed in the left internal jugular vein using the Seldinger technique, under direct visualization with ultrasound.  Evaluation Blood flow good Complications: No apparent complications Patient did tolerate procedure well. Chest X-ray ordered to verify placement.  CXR: normal.  Overton Mam, M.D. Pulmonary and Critical Care Medicine Call E-link with questions [phone] 11/06/2013, 12:39 AM

## 2013-11-06 NOTE — Progress Notes (Signed)
UR Completed.  Vangie Bicker [phone] 11/06/2013

## 2013-11-06 NOTE — Progress Notes (Signed)
Subjective: Feeling better.  She reports 4/10 "ache" in her chest and left shoulder.  May be worse with inspiration and palpation.  Objective: Vital signs in last 24 hours: Temp:  [95.7 F (35.4 C)-97.7 F (36.5 C)] 97.7 F (36.5 C) (12/12 0700) Pulse Rate:  [60-81] 71 (12/12 0700) Resp:  [12-21] 15 (12/12 0700) BP: (61-114)/(33-79) 94/44 mmHg (12/12 0700) SpO2:  [95 %-100 %] 100 % (12/12 0700) Weight:  [155 lb (70.308 kg)-166 lb 7.2 oz (75.5 kg)] 166 lb 7.2 oz (75.5 kg) (12/11 2100) Last BM Date:  (pta)  Intake/Output from previous day: 12/11 0701 - 12/12 0700 In: 7793.4 [I.V.:5593.4; IV Piggyback:2200] Out: 2455 [Urine:2455] Intake/Output this shift: Total I/O In: -  Out: 100 [Urine:100]  Medications Current Facility-Administered Medications  Medication Dose Route Frequency Provider Last Rate Last Dose  . 0.9 %  sodium chloride infusion   Intravenous Continuous Coralyn Helling, MD 150 mL/hr at 11/06/13 0345    . calcium chloride injection   Intravenous PRN Arloa Koh, MD   1 g at 11/05/13 1653  . dextrose 5 %-0.45 % sodium chloride infusion   Intravenous Continuous Coralyn Helling, MD 125 mL/hr at 11/06/13 1610    . dextrose 50 % solution 25 mL  25 mL Intravenous PRN Coralyn Helling, MD      . heparin injection 5,000 Units  5,000 Units Subcutaneous Q8H Coralyn Helling, MD   5,000 Units at 11/06/13 0543  . insulin regular (NOVOLIN R,HUMULIN R) 1 Units/mL in sodium chloride 0.9 % 100 mL infusion   Intravenous Continuous Arloa Koh, MD 10.6 mL/hr at 11/06/13 0914 10.6 Units/hr at 11/06/13 0914  . ondansetron (ZOFRAN) injection 4 mg  4 mg Intravenous Q6H PRN Coralyn Helling, MD      . pantoprazole (PROTONIX) injection 40 mg  40 mg Intravenous Q24H Coralyn Helling, MD   40 mg at 11/05/13 2148  . sodium bicarbonate injection 50 mEq  50 mEq Intravenous Once Arloa Koh, MD        PE: General appearance: alert, cooperative and no distress Lungs: clear to auscultation bilaterally Heart: regular  rate and rhythm, S1, S2 normal, no murmur, click, rub or gallop Extremities: No LEE Pulses: 2+ and symmetric Skin: Warm and dry Neurologic: Grossly normal  Lab Results:   Recent Labs  11/05/13 1649 11/05/13 1651 11/05/13 2000  WBC 14.4*  --  14.4*  HGB 11.5* 12.9 10.3*  HCT 37.9 38.0 32.0*  PLT 158  --  149*   BMET  Recent Labs  11/06/13 0200 11/06/13 0415 11/06/13 0810  NA 147* 148* 151*  K 3.8 3.4* 3.7  CL 114* 115* 120*  CO2 19 22 23   GLUCOSE 599* 464* 187*  BUN 53* 50* 45*  CREATININE 2.34* 2.22* 1.86*  CALCIUM 9.2 9.3 9.3   PT/INR  Recent Labs  11/05/13 1649  LABPROT 18.4*  INR 1.58*   Cardiac Panel (last 3 results)  Recent Labs  11/05/13 1728 11/05/13 2328 11/06/13 0416  TROPONINI <0.30 <0.30 <0.30   Assessment/Plan   Principal Problem:   Hyperglycemia Active Problems:   Altered mental status   ST elevation, secondary to hyperkalemia   Hyperkalemia   Diabetes type 1, uncontrolled, new uncontrolled possibly due to pump malfunction   DKA (diabetic ketoacidoses)  Plan:  Improved EKG.  No MI.  CP could be related to fall.  She reports just having a stress test with Dr. Ashok Cordia which was ok.  She was supposed to follow up the first of  the year.  No family history of CAD.  I called for records.  2D echo pending.  K+ and CBG improved considerably.   Hypotension treated with fluids.     LOS: 1 day    Jestina Stephani 11/06/2013 9:19 AM

## 2013-11-06 NOTE — Progress Notes (Signed)
Chaplain paged to code stemi that was later cancelled. Chaplain provided pastoral care to patient's family. Chaplain referred call to [facility].   11/05/13 1630  Clinical Encounter Type  Visited With Family  Visit Type Initial;ED  Referral From Nurse  Consult/Referral To Chaplain

## 2013-11-06 NOTE — Progress Notes (Addendum)
Inpatient Diabetes Program Recommendations  AACE/ADA: New Consensus Statement on Inpatient Glycemic Control (2013)  Target Ranges:  Prepandial:   less than 140 mg/dL      Peak postprandial:   less than 180 mg/dL (1-2 hours)      Critically ill patients:  140 - 180 mg/dL   Reason for Visit: Patient admitted with DKA. Glucose=1164 mg/dL on admit.  Patient has [employer] insulin pump.  Asked husband to bring pump in so that Diabetes Coordinator could examine pump.  Patient see's Dr. Almira Bar in [place], [place].  States that her last A1C was 8.0%. 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  Insulin pump had no alarms since 10/29/13 which was a low reservoir alarm.  Called 1-800 number for [employer] insulin pump support to report DKA episode.   Insulin pump settings are: Total basal=18.4 units/24 hours 12a-0.7 units/hr 8a-  0.8 units/hr Her insulin to CHO ratio is 1 unit for 14 grams of CHO.  Her correction factor is 12a- 60 mg/dL, 2G-40 mg/dL, and 1U-27 mg/dL with a goal CBG of 253 mg/dL.  Based on history in insulin pump, patient changed insulin pump site on December 10 and 1:28 pm.  Then at 3:37 pm CBG was up to 515 mg/dL.  It appears that after changing her site, her CBG's climbed which indicates a probably site issue. I was able to do some troubleshooting with [employer] representative on the phone which did not show any mechanical issues . Patient does not have any insulin pump supplies available at this time. Therefore will need to be discharged home on Lantus/Novolog regimen until she gets home.  Consider reducing Novolog to sensitive and add Novolog meal coverage 3 units tid with meals once patient is eating.  Explained to patient and she verbalized understanding.( It is important that she restarts the insulin pump 22 hours after last dose of Lantus.)  Beryl Meager, RN, BC-ADM Inpatient  Diabetes Coordinator Pager [phone]

## 2013-11-07 DIAGNOSIS — E1065 Type 1 diabetes mellitus with hyperglycemia: Secondary | ICD-10-CM

## 2013-11-07 LAB — BASIC METABOLIC PANEL
CO2: 24 mEq/L (ref 19–32)
CO2: 24 mEq/L (ref 19–32)
Calcium: 8.6 mg/dL (ref 8.4–10.5)
Chloride: 110 mEq/L (ref 96–112)
Creatinine, Ser: 1.39 mg/dL — ABNORMAL HIGH (ref 0.50–1.10)
Creatinine, Ser: 1.59 mg/dL — ABNORMAL HIGH (ref 0.50–1.10)
GFR calc Af Amer: 38 mL/min — ABNORMAL LOW (ref >90)
GFR calc non Af Amer: 33 mL/min — ABNORMAL LOW (ref >90)
Glucose, Bld: 283 mg/dL — ABNORMAL HIGH (ref 70–99)
Potassium: 3.7 mEq/L (ref 3.5–5.1)
Sodium: 147 mEq/L — ABNORMAL HIGH (ref 135–145)

## 2013-11-07 LAB — GLUCOSE, CAPILLARY
Glucose-Capillary: 201 mg/dL — ABNORMAL HIGH (ref 70–99)
Glucose-Capillary: 215 mg/dL — ABNORMAL HIGH (ref 70–99)
Glucose-Capillary: 233 mg/dL — ABNORMAL HIGH (ref 70–99)
Glucose-Capillary: 248 mg/dL — ABNORMAL HIGH (ref 70–99)

## 2013-11-07 MED ORDER — SIMVASTATIN 10 MG PO TABS
10.0000 mg | ORAL_TABLET | Freq: Every day | ORAL | Status: DC
  Administered 2013-11-07: 10 mg via ORAL
  Filled 2013-11-07 (×3): qty 1

## 2013-11-07 MED ORDER — LEVOTHYROXINE SODIUM 50 MCG PO TABS
50.0000 ug | ORAL_TABLET | Freq: Every day | ORAL | Status: DC
  Administered 2013-11-08: 50 ug via ORAL
  Filled 2013-11-07 (×2): qty 1

## 2013-11-07 MED ORDER — OXYBUTYNIN CHLORIDE ER 5 MG PO TB24
5.0000 mg | ORAL_TABLET | Freq: Every day | ORAL | Status: DC
  Administered 2013-11-08: 5 mg via ORAL
  Filled 2013-11-07: qty 1

## 2013-11-07 MED ORDER — SODIUM CHLORIDE 0.9 % IV SOLN: INTRAVENOUS | Status: DC | PRN

## 2013-11-07 MED ORDER — INSULIN ASPART 100 UNIT/ML ~~LOC~~ SOLN
0.0000 [IU] | Freq: Three times a day (TID) | SUBCUTANEOUS | Status: DC
  Administered 2013-11-08: 11 [IU] via SUBCUTANEOUS
  Administered 2013-11-08: 8 [IU] via SUBCUTANEOUS

## 2013-11-07 MED ORDER — SODIUM CHLORIDE 0.9 % IJ SOLN
10.0000 mL | INTRAMUSCULAR | Status: DC | PRN
  Administered 2013-11-07 – 2013-11-08 (×2): 30 mL

## 2013-11-07 NOTE — Progress Notes (Signed)
Foley d/c'd yesterday at 47. I & O cath produced 275 urine at 2200. Pt has used BSC several times and has been unable to void. Bladder scan volume at 0400 was 189. Elink MD notified. Suggested we encourage more PO fluids and not to replace foley yet. Will continue to monitor.

## 2013-11-07 NOTE — Progress Notes (Signed)
Attempted PIV- unsuccessful.  Pt refusal of further PIV attempts and prefers to leave LIJ CVC in.  States potential discharge tomorrow.  [facility] CDI.  No ADN.

## 2013-11-07 NOTE — Progress Notes (Signed)
Notified IV team that patient will be transferred to 6N room 25 with a L IJ central line.  Unable to place peripheral IV at this time.  IV team aware, and will place PIV and remove central line on 6N.

## 2013-11-07 NOTE — Progress Notes (Signed)
PULMONARY  / CRITICAL CARE MEDICINE  Name: Latoya Young MRN: [id number] DOB: [birth year]    ADMISSION DATE:  11/05/2013  PRIMARY SERVICE: PCCM  CHIEF COMPLAINT:  DKA  BRIEF PATIENT DESCRIPTION:  67 years old female with PMH relevant for DM1. Presented with DKA due to malfunction of her insulin pump.   SIGNIFICANT EVENTS: 12/11 Admit to ICU with DKA 12/13 off insulin gtt, transfer to floor  STUDIES:  12/12 Echo >> EF 60 to 65%, PAS 40 mmHg  LINES / TUBES: L IJ CVL 12/11 >> 12/13  SUBJECTIVE:  Feels better.  Denies chest pain, nausea, dyspnea.  VITAL SIGNS: Temp:  [98 F (36.7 C)-100.1 F (37.8 C)] 98 F (36.7 C) (12/13 0746) Pulse Rate:  [60-85] 70 (12/13 1000) Resp:  [11-24] 11 (12/13 1000) BP: (86-144)/(38-87) 144/54 mmHg (12/13 1000) SpO2:  [58 %-100 %] 99 % (12/13 1000) Weight:  [170 lb 10.2 oz (77.4 kg)] 170 lb 10.2 oz (77.4 kg) (12/13 0300) Room air  INTAKE / OUTPUT: Intake/Output     12/12 0701 - 12/13 0700 12/13 0701 - 12/14 0700   P.O. 1320 120   I.V. (mL/kg) 1726.4 (22.3) 225 (2.9)   IV Piggyback     Total Intake(mL/kg) 3046.4 (39.4) 345 (4.5)   Urine (mL/kg/hr) 565 (0.3) 570 (1.6)   Total Output 565 570   Net +2481.4 -225        Stool Occurrence 1 x      PHYSICAL EXAMINATION: General: NAD Neuro: intact Heart: RRR s M Lungs: clear Abdomen: soft, NT, NABS Ext: warm, no edema   LABS: CBC Recent Labs     11/05/13  1649  11/05/13  1651  11/05/13  2000  WBC  14.4*   --   14.4*  HGB  11.5*  12.9  10.3*  HCT  37.9  38.0  32.0*  PLT  158   --   149*    Coag's Recent Labs     11/05/13  1649  APTT  28  INR  1.58*    BMET Recent Labs     11/06/13  1500 11/07/13  11/07/13  0758  NA  152*  147*  142  K  4.2  4.2  3.7  CL  121*  116*  110  CO2  23  24  24   BUN  44*  43*  34*  CREATININE  1.72*  1.59*  1.39*  GLUCOSE  142*  283*  184*    Electrolytes Recent Labs     11/05/13  2347   11/06/13  0415   11/06/13  1500 11/07/13   11/07/13  0758  CALCIUM  8.3*   < >  9.3   < >  8.7  8.6  8.6  MG  2.3   --   2.2   --    --    --    --   PHOS  4.5   --   1.9*   --    --    --    --    < > = values in this interval not displayed.    ABG Recent Labs     11/05/13  1656  PHART  7.206*  PCO2ART  21.1*  PO2ART  204.0*    Liver Enzymes Recent Labs     11/05/13  1650  AST  26  ALT  31  ALKPHOS  118*  BILITOT  0.6  ALBUMIN  3.1*    Cardiac Enzymes Recent Labs  11/05/13  1728  11/05/13  2328  11/06/13  0416  TROPONINI  <0.30  <0.30  <0.30    Glucose Recent Labs     11/06/13  1533  11/06/13  2004  11/07/13  0007  11/07/13  0354  11/07/13  0732  11/07/13  1117  GLUCAP  98  169*  233*  201*  138*  284*    Imaging Dg Chest Port 1 View  11/06/2013   CLINICAL DATA:  Central line placement.  EXAM: PORTABLE CHEST - 1 VIEW  COMPARISON:  Chest radiograph performed earlier today at 5:19 p.m.  FINDINGS: The patient's left IJ line is noted ending about the mid SVC.  The lungs are well-aerated. Mild vascular congestion is noted, without significant pulmonary edema. Mild bibasilar opacities likely reflect atelectasis. Known pleural effusion or pneumothorax is seen.  The cardiomediastinal silhouette is borderline normal in size. No acute osseous abnormalities are identified.  IMPRESSION: 1. Left IJ line noted ending about the mid SVC. 2. Mild vascular congestion; mild bibasilar airspace opacities likely reflect atelectasis.   Electronically Signed   By: Roanna Raider M.D.   On: 11/06/2013 00:24   Dg Chest Portable 1 View  11/05/2013   CLINICAL DATA:  Hyperglycemia, bradycardia, altered mental status, history hypertension, type 1 diabetes  EXAM: PORTABLE CHEST - 1 VIEW  COMPARISON:  Portable exam 1719 hr without priors for comparison  FINDINGS: Upper normal heart size with slight pulmonary vascular congestion.  Atherosclerotic calcification aortic arch.  Lungs clear.  No pleural effusion or pneumothorax.  Old  lower left rib fractures.  IMPRESSION: No acute abnormalities.   Electronically Signed   By: Ulyses Southward M.D.   On: 11/05/2013 17:34       ASSESSMENT / PLAN:  A:   DKA due to insulin pump malfunction. Hx of hypothyroidism. P:   SSI with lantus Resume levothyroxine  A:  Nonspecific EKG changes >> negative cardiac markers, and normal EF on Echo. Hx of hyperlipidemia. P:  Monitor clinically Resume simvastatin  A:  Acute renal failure, hyperkalemia 2nd to DKA and hypovolemia >> resolved; likely from metabolic derangements. Irritable bladder. P:   Monitor renal fx, urine outpt, electrolytes Resume ditropan  A:   Anemia, likely chronic disease.  P:  Monitor CBC intermittently  A: Acute encephalopathy 2nd to DKA >> resolved. P: Monitor clinically   Summary: Transfer to floor bed.  D/c CVL.  Advance diet.  Continue SSI with lantus.  She will need to have follow up with PCP in [place] to arrange for insulin pump.    Coralyn Helling, MD [facility] Pulmonary/Critical Care 11/07/2013, 11:46 AM Pager:  [phone] After 3pm call: [phone]

## 2013-11-08 DIAGNOSIS — R9431 Abnormal electrocardiogram [ECG] [EKG]: Secondary | ICD-10-CM

## 2013-11-08 LAB — BASIC METABOLIC PANEL
BUN: 23 mg/dL (ref 6–23)
CO2: 27 mEq/L (ref 19–32)
GFR calc Af Amer: 61 mL/min — ABNORMAL LOW (ref >90)
GFR calc non Af Amer: 52 mL/min — ABNORMAL LOW (ref >90)
Glucose, Bld: 329 mg/dL — ABNORMAL HIGH (ref 70–99)
Potassium: 4.3 mEq/L (ref 3.5–5.1)
Sodium: 140 mEq/L (ref 135–145)

## 2013-11-08 LAB — CBC
HCT: 34.4 % — ABNORMAL LOW (ref 36.0–46.0)
Platelets: 112 10*3/uL — ABNORMAL LOW (ref 150–400)
RBC: 3.88 MIL/uL (ref 3.87–5.11)
RDW: 12.8 % (ref 11.5–15.5)
WBC: 4.7 10*3/uL (ref 4.0–10.5)

## 2013-11-08 LAB — PHOSPHORUS: Phosphorus: 2.7 mg/dL (ref 2.3–4.6)

## 2013-11-08 LAB — MAGNESIUM: Magnesium: 1.6 mg/dL (ref 1.5–2.5)

## 2013-11-08 LAB — GLUCOSE, CAPILLARY: Glucose-Capillary: 328 mg/dL — ABNORMAL HIGH (ref 70–99)

## 2013-11-08 MED ORDER — INSULIN GLARGINE 100 UNIT/ML ~~LOC~~ SOLN: 20.0000 [IU] | Freq: Every day | SUBCUTANEOUS | Status: AC

## 2013-11-08 MED ORDER — INSULIN ASPART 100 UNIT/ML ~~LOC~~ SOLN: SUBCUTANEOUS | Status: AC

## 2013-11-08 NOTE — Progress Notes (Signed)
LIJ CVC removed per order.  Pt and husband verbalizes understanding of site care, signs of infection and bleeding and interventions.  Site cleaned with chlorhexadine, covered with vaseline guaze and dry 4x4.  No ADN.

## 2013-11-08 NOTE — Progress Notes (Signed)
PULMONARY  / CRITICAL CARE MEDICINE  Name: Latoya Young MRN: [id number] DOB: 10/12/1946    ADMISSION DATE:  11/05/2013  PRIMARY SERVICE: PCCM  CHIEF COMPLAINT:  DKA  BRIEF PATIENT DESCRIPTION:  67 years old female with PMH relevant for DM1. Presented with DKA due to malfunction of her insulin pump.   SIGNIFICANT EVENTS: 12/11 Admit to ICU with DKA 12/13 off insulin gtt, transfer to floor  STUDIES:  12/12 Echo >> EF 60 to 65%, PAS 40 mmHg  LINES / TUBES: L IJ CVL 12/11 >> 12/13  SUBJECTIVE:  Feels better.  Denies chest pain, nausea, dyspnea.She is aware sugar today >300 and blames "they gave me a big meal last night".Family in room. She is eager to head back to her doctor in [place], [place].  VITAL SIGNS: Temp:  [98.2 F (36.8 C)-98.9 F (37.2 C)] 98.3 F (36.8 C) (12/14 0525) Pulse Rate:  [62-73] 73 (12/14 0525) Resp:  [11-18] 18 (12/14 0525) BP: (135-152)/(54-64) 146/61 mmHg (12/14 0525) SpO2:  [98 %-100 %] 100 % (12/14 0525) Room air  INTAKE / OUTPUT: Intake/Output     12/13 0701 - 12/14 0700 12/14 0701 - 12/15 0700   P.O. 600    I.V. (mL/kg) 255 (3.3)    Total Intake(mL/kg) 855 (11)    Urine (mL/kg/hr) 570 (0.3)    Total Output 570     Net +285          Urine Occurrence 8 x    Stool Occurrence 2 x      PHYSICAL EXAMINATION: General: NAD  CVL L neck Neuro: intact. Alert, oriented, cooperative Heart: RRR s M Lungs: clear Abdomen: soft, NT, NABS Ext: warm, no edema   LABS: CBC Recent Labs     11/05/13  1649  11/05/13  1651  11/05/13  2000  11/08/13  0515  WBC  14.4*   --   14.4*  4.7  HGB  11.5*  12.9  10.3*  11.3*  HCT  37.9  38.0  32.0*  34.4*  PLT  158   --   149*  112*    Coag's Recent Labs     11/05/13  1649  APTT  28  INR  1.58*    BMET Recent Labs    11/07/13  11/07/13  0758  11/08/13  0515  NA  147*  142  140  K  4.2  3.7  4.3  CL  116*  110  106  CO2  24  24  27   BUN  43*  34*  23  CREATININE  1.59*  1.39*  1.08   GLUCOSE  283*  184*  329*    Electrolytes Recent Labs     11/05/13  2347   11/06/13  0415  11/07/13  11/07/13  0758  11/08/13  0515  CALCIUM  8.3*   < >  9.3   < >  8.6  8.6  8.8  MG  2.3   --   2.2   --    --    --   1.6  PHOS  4.5   --   1.9*   --    --    --   2.7   < > = values in this interval not displayed.    ABG Recent Labs     11/05/13  1656  PHART  7.206*  PCO2ART  21.1*  PO2ART  204.0*    Liver Enzymes Recent Labs     11/05/13  1650  AST  26  ALT  31  ALKPHOS  118*  BILITOT  0.6  ALBUMIN  3.1*    Cardiac Enzymes Recent Labs     11/05/13  1728  11/05/13  2328  11/06/13  0416  TROPONINI  <0.30  <0.30  <0.30    Glucose Recent Labs     11/07/13  0354  11/07/13  0732  11/07/13  1117  11/07/13  1702  11/07/13  2118  11/08/13  0805  GLUCAP  201*  138*  284*  248*  215*  328*    Imaging No results found.     ASSESSMENT / PLAN:  A:   DKA due to insulin pump malfunction. Hx of hypothyroidism. P:   SSI with lantus. She has Novolog, syringes, glucometer. With glu today 329, would add Lantus at least a few days, till she reaches her MD in [facility]. Resumed levothyroxine  A:  Nonspecific EKG changes >> negative cardiac markers, and normal EF on Echo. Hx of hyperlipidemia. P:  Monitor clinically Resume simvastatin  A:  Acute renal failure, hyperkalemia 2nd to DKA and hypovolemia >> resolved; likely from metabolic derangements. Irritable bladder. P:   Monitor renal fx, urine outpt, electrolytes Resume ditropan  A:   Anemia, likely chronic disease.  P:  Monitor CBC intermittently  A: Acute encephalopathy 2nd to DKA >> resolved. P: Monitor clinically   Summary:  Ready to return home. Needs D/c CVL.  Advance diet.  Continue SSI with lantus.  She will need to have follow up with PCP in [place] to arrange for insulin pump.    CD Maple Hudson, MD [facility] Pulmonary/Critical Care 11/08/2013, 9:11 AM Pager:  [phone] After 3pm  call: [phone]

## 2013-11-08 NOTE — Discharge Summary (Signed)
Physician Discharge Summary  Patient ID: Latoya Young MRN: [id number] DOB/AGE: November 03, 1946 67 y.o.  Admit date: 11/05/2013 Discharge date: 11/08/2013    Discharge Diagnoses:  Principal Problem:   Hyperglycemia Active Problems:   Altered mental status   ST elevation, secondary to hyperkalemia   Hyperkalemia   Diabetes type 1, uncontrolled, new uncontrolled possibly due to pump malfunction   DKA (diabetic ketoacidoses)    Brief Summary: Latoya Young is a 67 y.o. y/o female with a PMH of DM1 on insulin pump who was visiting [place] from [facility] for a graduation.  She presented to ER 12/11 with AMS and was found to be in DKA.  She is now able to tell us that she think the needle was not in the skin fully.  She noted increasing blood sugars and continued to increase the rate on her pump without results.  She had no evidence of infection.  She did have some c/o chest pain, was evaluated by cardiology and was r/o for MI with EKG changes being likely r/t hyperkalemia.  She was admitted by [facility] for ICU management of DKA and insulin gtt.  She improved quickly with DKA protocol insulin gtt and electrolyte management.  She is now back to baseline.  Blood sugars controlled with SSI and lantus.  Because she is from [facility], she will be d/c today on SSI and lantus (which she has done in the past and is comfortable with) and will f/u with her endocrinologist Dr. Cherlyn Roberts in [facility] 12/15 for insulin pump evaluation.  She and her husband are traveling back to [facility] immediately upon d/c today.  She has been counseled on sliding scale, lantus, s/s hyper and hypoglycemia and what s/s should prompt them stopping their trip and seeking medical attention.     LINES / TUBES:  - Left IJ CVC 12/11>>>12/14 - Foley catheter  - Right EJ peripheral IV   CULTURES:  Pending   ANTIBIOTICS:  No antibiotics                                                                     D/C Plan by Discharge Diagnosis  DKA due to insulin pump  malfunction.  Hx of hypothyroidism.  D/c plan--- SSI with lantus. She has Novolog, syringes, glucometer. With elevated glucose today, will add Lantus (given) and continue at least a few days, till she reaches her MD in [facility].  Resumed levothyroxine    Nonspecific EKG changes >> negative cardiac markers, and normal EF on Echo.  Hx of hyperlipidemia.  D/c plan --  Monitor clinically  Resume simvastatin    Acute renal failure, hyperkalemia 2nd to DKA and hypovolemia >> resolved; likely from metabolic derangements.  Irritable bladder.  D/c plan -- Monitor renal fx, urine outpt, electrolytes  Resume ditropan  PCP f/u    Anemia, likely chronic disease.  D/c plan--  Monitor CBC intermittently    Acute encephalopathy 2nd to DKA >> resolved.  D/c plan-- Monitor clinically    Filed Vitals:   11/07/13 1345 11/07/13 2113 11/08/13 0525 11/08/13 1316  BP: 148/64 135/58 146/61 156/74  Pulse: 67 71 73 67  Temp: 98.2 F (36.8 C) 98.9 F (37.2 C) 98.3 F (36.8 C) 98.2 F (36.8 C)  TempSrc: Oral Oral  Oral Oral  Resp: 16 18 18 18   Height:      Weight:      SpO2: 100% 100% 100% 100%     Discharge Labs  BMET  Recent Labs Lab 11/05/13 1651 11/05/13 2347  11/06/13 0415 11/06/13 0810 11/06/13 1500 11/07/13 11/07/13 0758 11/08/13 0515  NA 129* 146*  < > 148* 151* 152* 147* 142 140  K 7.7* 4.0  < > 3.4* 3.7 4.2 4.2 3.7 4.3  CL 100 113*  < > 115* 120* 121* 116* 110 106  CO2  --  14*  < > 22 23 23 24 24 27   GLUCOSE >700* 704*  < > 464* 187* 142* 283* 184* 329*  BUN 52* 52*  < > 50* 45* 44* 43* 34* 23  CREATININE 3.40* 2.26*  < > 2.22* 1.86* 1.72* 1.59* 1.39* 1.08  CALCIUM  --  8.3*  < > 9.3 9.3 8.7 8.6 8.6 8.8  MG  --  2.3  --  2.2  --   --   --   --  1.6  PHOS  --  4.5  --  1.9*  --   --   --   --  2.7  < > = values in this interval not displayed.   CBC   Recent Labs Lab 11/05/13 1649 11/05/13 1651 11/05/13 2000 11/08/13 0515  HGB 11.5* 12.9 10.3* 11.3*  HCT 37.9  38.0 32.0* 34.4*  WBC 14.4*  --  14.4* 4.7  PLT 158  --  149* 112*   Anti-Coagulation  Recent Labs Lab 11/05/13 1649  INR 1.58*      Discharge Orders   Future Orders Complete By Expires   Diet Carb Modified  As directed          Follow-up Information   Schedule an appointment as soon as possible for a visit with Dr. Cherlyn Roberts - endocrinology . (Call and make an appointment for 12/15 as soon as you get back to [facility])    Contact information:   [address]  [place], [place] [address] [phone]          Medication List         insulin aspart 100 UNIT/ML injection  Commonly known as:  novoLOG  - CBG 70 - 120: 0 units  - CBG 121 - 150: 2 units  - CBG 151 - 200: 3 units  - CBG 201 - 250: 5 units  - CBG 251 - 300: 8 units  - CBG 301 - 350: 11 units  - CBG 351 - 400: 15 units     insulin glargine 100 UNIT/ML injection  Commonly known as:  LANTUS  Inject 0.2 mLs (20 Units total) into the skin daily.     levothyroxine 50 MCG tablet  Commonly known as:  SYNTHROID, LEVOTHROID  Take 50 mcg by mouth daily before breakfast.     oxybutynin 5 MG 24 hr tablet  Commonly known as:  DITROPAN-XL  Take 5 mg by mouth daily.     simvastatin 10 MG tablet  Commonly known as:  ZOCOR  Take 10 mg by mouth at bedtime.          Disposition: Final discharge disposition not confirmed  Discharged Condition: Latoya Young has met maximum benefit of inpatient care and is medically stable and cleared for discharge.  Patient is pending follow up as above.      Time spent on disposition:  Greater than 35 minutes.   Signed:  Danford Bad, NP 11/08/2013  1:20 PM Pager: [phone] or [phone]  *Care during the described time interval was provided by me and/or other providers on the critical care team. I have reviewed this patient's available data, including medical history, events of note, physical examination and test results as part of my evaluation.

## 2013-11-09 LAB — GLUCOSE, CAPILLARY: Glucose-Capillary: 260 mg/dL — ABNORMAL HIGH (ref 70–99)

## 2015-05-06 IMAGING — CR DG CHEST 1V PORT
1 series · 1 of 1 positions shown · non-contrast
Comparison: Chest radiograph performed earlier today at [DATE] p.m.

CLINICAL DATA: Central line placement.

EXAM:
PORTABLE CHEST - 1 VIEW

[AP]
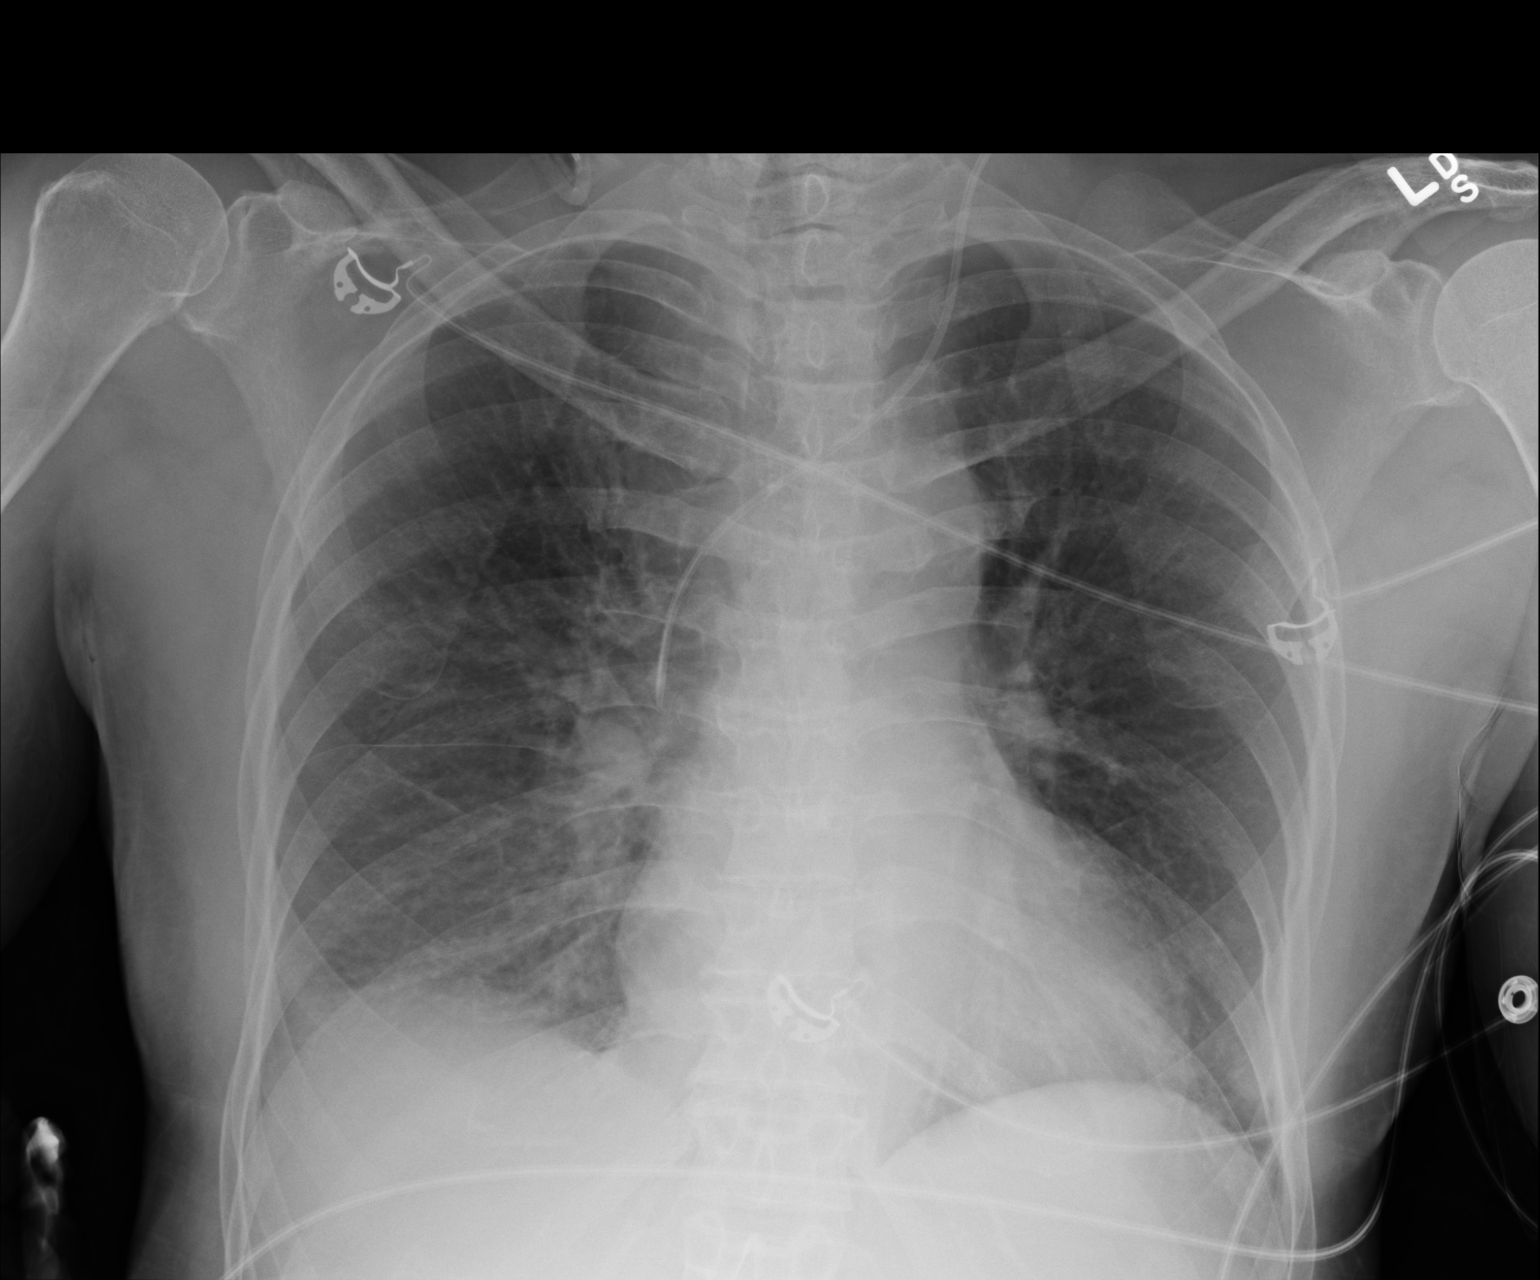

[1 of 1 positions shown; findings below may reference images not displayed]

FINDINGS: The patient's left IJ line is noted ending about the mid SVC.

The lungs are well-aerated. Mild vascular congestion is noted,
without significant pulmonary edema. Mild bibasilar opacities likely
reflect atelectasis. Known pleural effusion or pneumothorax is seen.

The cardiomediastinal silhouette is borderline normal in size. No
acute osseous abnormalities are identified.
IMPRESSION: 1. Left IJ line noted ending about the mid SVC.
2. Mild vascular congestion; mild bibasilar airspace opacities
likely reflect atelectasis.

## 2024-08-26 DEATH — deceased
